# Patient Record
Sex: Female | Born: 1950 | Race: Black or African American | Hispanic: No | Marital: Married | Smoking: Never smoker
Health system: Southern US, Community
[De-identification: ages and names within clinical notes are randomized; demographics above are authoritative.]

## PROBLEM LIST (undated history)

## (undated) DIAGNOSIS — K219 Gastro-esophageal reflux disease without esophagitis: Secondary | ICD-10-CM

## (undated) DIAGNOSIS — N200 Calculus of kidney: Secondary | ICD-10-CM

## (undated) HISTORY — PX: LITHOTRIPSY: SUR834

## (undated) HISTORY — DX: Gastro-esophageal reflux disease without esophagitis: K21.9

---

## 1994-08-20 HISTORY — PX: ABDOMINAL HYSTERECTOMY: SHX81

## 1997-12-09 ENCOUNTER — Other Ambulatory Visit: Admission: RE | Admit: 1997-12-09 | Discharge: 1997-12-09 | Payer: Self-pay | Admitting: Internal Medicine

## 1998-01-05 ENCOUNTER — Ambulatory Visit (HOSPITAL_COMMUNITY): Admission: RE | Admit: 1998-01-05 | Discharge: 1998-01-05 | Payer: Self-pay | Admitting: Gastroenterology

## 1999-02-22 ENCOUNTER — Ambulatory Visit (HOSPITAL_COMMUNITY): Admission: RE | Admit: 1999-02-22 | Discharge: 1999-02-22 | Payer: Self-pay | Admitting: Internal Medicine

## 1999-02-22 ENCOUNTER — Encounter: Payer: Self-pay | Admitting: Internal Medicine

## 1999-09-20 ENCOUNTER — Encounter: Admission: RE | Admit: 1999-09-20 | Discharge: 1999-09-20 | Payer: Self-pay | Admitting: Internal Medicine

## 1999-09-20 ENCOUNTER — Encounter: Payer: Self-pay | Admitting: Internal Medicine

## 2000-02-27 ENCOUNTER — Ambulatory Visit (HOSPITAL_COMMUNITY): Admission: RE | Admit: 2000-02-27 | Discharge: 2000-02-27 | Payer: Self-pay | Admitting: Internal Medicine

## 2000-02-27 ENCOUNTER — Encounter: Payer: Self-pay | Admitting: Internal Medicine

## 2001-02-13 ENCOUNTER — Other Ambulatory Visit: Admission: RE | Admit: 2001-02-13 | Discharge: 2001-02-13 | Payer: Self-pay | Admitting: Internal Medicine

## 2001-03-06 ENCOUNTER — Ambulatory Visit (HOSPITAL_COMMUNITY): Admission: RE | Admit: 2001-03-06 | Discharge: 2001-03-06 | Payer: Self-pay | Admitting: Internal Medicine

## 2001-03-06 ENCOUNTER — Encounter: Payer: Self-pay | Admitting: Internal Medicine

## 2001-10-30 ENCOUNTER — Ambulatory Visit (HOSPITAL_COMMUNITY): Admission: RE | Admit: 2001-10-30 | Discharge: 2001-10-30 | Payer: Self-pay | Admitting: Gastroenterology

## 2002-09-18 ENCOUNTER — Ambulatory Visit (HOSPITAL_COMMUNITY): Admission: RE | Admit: 2002-09-18 | Discharge: 2002-09-18 | Payer: Self-pay | Admitting: Internal Medicine

## 2002-09-18 ENCOUNTER — Encounter: Payer: Self-pay | Admitting: Internal Medicine

## 2002-10-19 ENCOUNTER — Encounter (INDEPENDENT_AMBULATORY_CARE_PROVIDER_SITE_OTHER): Payer: Self-pay | Admitting: *Deleted

## 2004-03-29 ENCOUNTER — Encounter: Admission: RE | Admit: 2004-03-29 | Discharge: 2004-03-29 | Payer: Self-pay | Admitting: Sports Medicine

## 2004-03-29 ENCOUNTER — Ambulatory Visit (HOSPITAL_COMMUNITY): Admission: RE | Admit: 2004-03-29 | Discharge: 2004-03-29 | Payer: Self-pay | Admitting: Family Medicine

## 2005-04-13 ENCOUNTER — Ambulatory Visit (HOSPITAL_COMMUNITY): Admission: RE | Admit: 2005-04-13 | Discharge: 2005-04-13 | Payer: Self-pay | Admitting: Obstetrics and Gynecology

## 2005-04-18 ENCOUNTER — Encounter: Admission: RE | Admit: 2005-04-18 | Discharge: 2005-04-18 | Payer: Self-pay | Admitting: Obstetrics and Gynecology

## 2005-06-12 ENCOUNTER — Encounter: Admission: RE | Admit: 2005-06-12 | Discharge: 2005-08-16 | Payer: Self-pay | Admitting: Family Medicine

## 2005-07-09 ENCOUNTER — Encounter: Admission: RE | Admit: 2005-07-09 | Discharge: 2005-07-09 | Payer: Self-pay | Admitting: Obstetrics and Gynecology

## 2005-08-30 ENCOUNTER — Encounter: Admission: RE | Admit: 2005-08-30 | Discharge: 2005-10-25 | Payer: Self-pay | Admitting: Orthopedic Surgery

## 2005-11-08 ENCOUNTER — Encounter: Admission: RE | Admit: 2005-11-08 | Discharge: 2005-12-25 | Payer: Self-pay | Admitting: Orthopedic Surgery

## 2006-05-03 ENCOUNTER — Encounter: Admission: RE | Admit: 2006-05-03 | Discharge: 2006-05-03 | Payer: Self-pay | Admitting: Obstetrics and Gynecology

## 2006-06-20 ENCOUNTER — Ambulatory Visit (HOSPITAL_COMMUNITY): Admission: RE | Admit: 2006-06-20 | Discharge: 2006-06-20 | Payer: Self-pay | Admitting: Obstetrics and Gynecology

## 2006-06-20 ENCOUNTER — Encounter (INDEPENDENT_AMBULATORY_CARE_PROVIDER_SITE_OTHER): Payer: Self-pay | Admitting: Specialist

## 2006-10-18 ENCOUNTER — Encounter (INDEPENDENT_AMBULATORY_CARE_PROVIDER_SITE_OTHER): Payer: Self-pay | Admitting: *Deleted

## 2007-08-01 ENCOUNTER — Encounter: Admission: RE | Admit: 2007-08-01 | Discharge: 2007-08-01 | Payer: Self-pay | Admitting: Obstetrics and Gynecology

## 2008-11-29 ENCOUNTER — Encounter: Admission: RE | Admit: 2008-11-29 | Discharge: 2008-11-29 | Payer: Self-pay | Admitting: Obstetrics and Gynecology

## 2009-12-01 ENCOUNTER — Encounter: Admission: RE | Admit: 2009-12-01 | Discharge: 2009-12-01 | Payer: Self-pay | Admitting: Obstetrics and Gynecology

## 2010-09-10 ENCOUNTER — Encounter: Payer: Self-pay | Admitting: Obstetrics and Gynecology

## 2010-09-10 ENCOUNTER — Encounter: Payer: Self-pay | Admitting: *Deleted

## 2010-11-27 ENCOUNTER — Other Ambulatory Visit: Payer: Self-pay | Admitting: Obstetrics and Gynecology

## 2010-11-27 DIAGNOSIS — Z1231 Encounter for screening mammogram for malignant neoplasm of breast: Secondary | ICD-10-CM

## 2010-12-15 ENCOUNTER — Ambulatory Visit: Payer: Self-pay

## 2010-12-19 ENCOUNTER — Other Ambulatory Visit: Payer: Self-pay | Admitting: Obstetrics and Gynecology

## 2010-12-19 DIAGNOSIS — Z1231 Encounter for screening mammogram for malignant neoplasm of breast: Secondary | ICD-10-CM

## 2010-12-19 DIAGNOSIS — Z780 Asymptomatic menopausal state: Secondary | ICD-10-CM

## 2010-12-20 ENCOUNTER — Other Ambulatory Visit: Payer: Self-pay | Admitting: Obstetrics and Gynecology

## 2010-12-20 DIAGNOSIS — Z1211 Encounter for screening for malignant neoplasm of colon: Secondary | ICD-10-CM

## 2010-12-20 DIAGNOSIS — Z86010 Personal history of colonic polyps: Secondary | ICD-10-CM

## 2011-01-05 ENCOUNTER — Ambulatory Visit
Admission: RE | Admit: 2011-01-05 | Discharge: 2011-01-05 | Disposition: A | Discharge status: Home / Self Care | Payer: BC Managed Care – PPO | Source: Ambulatory Visit | Attending: Obstetrics and Gynecology | Admitting: Obstetrics and Gynecology

## 2011-01-05 DIAGNOSIS — Z780 Asymptomatic menopausal state: Secondary | ICD-10-CM

## 2011-01-05 DIAGNOSIS — Z1231 Encounter for screening mammogram for malignant neoplasm of breast: Secondary | ICD-10-CM

## 2011-01-05 NOTE — Procedures (Signed)
[place]  Patient:    Sheila Sweeney, Sheila Sweeney Visit Number: [id number] MRN: [id number]          Service Type: END Location: ENDO Attending Physician:  Charna Elizabeth Dictated by:   Anselmo Rod, M.D. Proc. Date: 10/30/01 Admit Date:  10/30/2001 Discharge Date: 10/30/2001   CC:         Cala Bradford R. Renae Gloss, M.D.   Procedure Report  DATE OF BIRTH:  Dec 31, 1950  PROCEDURE PERFORMED:  Colonoscopy and endoscopy.  ENDOSCOPIST:  Anselmo Rod, M.D.  INSTRUMENT USED:  Olympus video colonoscope.  INDICATION FOR PROCEDURE:  Rectal bleeding in a 60 year old female to rule out colonic polyps, masses, hemorrhoids, etc.  PREPROCEDURE PREPARATION:  Informed consent was procured from the patient. The patient fasted for eight hours prior to the procedure and prepped with a bottle of magnesium citrate and a gallon of NuLytely the night prior to the procedure.  PREPROCEDURAL PHYSICAL:  VITAL SIGNS:  The patient had stable vital signs.  NECK:  Supple.  CHEST:  Clear to auscultation.  No rales or rhonchi.  ABDOMEN:  Soft with normal bowel sounds.  DESCRIPTION OF PROCEDURE:  The patient was placed in the left lateral decubitus position and sedated with 70 mg of Demerol and 6.5 mg of Versed intravenously.  Once the patient was adequately sedated and maintained on low flow oxygen, and continuous cardiac monitoring, the Olympus video colonoscope was advanced from the rectum to the cecum without difficulty.  The appendiceal orifice and ileocecal valve were visualized and photographed.  Small internal and external hemorrhoids were appreciated.  The entire colonic mucosa, otherwise, appeared healthy and without lesions.  IMPRESSION:  Normal appearing colon up to the cecum except for small nonbleeding internal and external hemorrhoids.  RECOMMENDATIONS: 1. A high fiber diet was suggested for the patient. 2. Repeat colorectal cancer screening is  recommended in the next 5-10    years unless the patient should develop any abnormal symptoms in the    interim.Dictated by:   Anselmo Rod, M.D. Attending Physician:  Charna Elizabeth DD:  10/30/01 TD:  11/01/01 Job: [id number]

## 2011-01-05 NOTE — Op Note (Signed)
NAME:  Sheila Sweeney, Sheila Sweeney                ACCOUNT NO.:  [id number]   MEDICAL RECORD NO.:  [id number]          PATIENT TYPE:  AMB   LOCATION:  SDC                           FACILITY:  [facility]   PHYSICIAN:  Huel Cote, M.D. DATE OF BIRTH:  16-Dec-1950   DATE OF PROCEDURE:  07/01/2006  DATE OF DISCHARGE:  06/20/2006                                 OPERATIVE REPORT   PREOPERATIVE DIAGNOSIS:  Vaginal nodule.   POSTOPERATIVE DIAGNOSIS:  Vaginal nodule.   PROCEDURE:  Excision of vaginal nodule.   SURGEON:  Huel Cote, M.D.   ANESTHESIA:  LMA with 1% plain local block.   SPECIMEN:  Vaginal nodule, approximately 3-4 cm; appearing to be a fibroid  by gross analysis.   COMPLICATIONS:  None.   FINDINGS:  Solid nodule was removed from the right vaginal sidewall,  approximately 3-4 cm in diameter.  It did grossly appear to be a fibroid and  was sent to pathology for final analysis.   PROCEDURE:  The patient was taken to the operating room, where LMA  anesthesia was obtained without difficulty.  She was then prepped and draped  in normal sterile fashion in the dorsal lithotomy position.  A speculum was  placed within the vagina, and a right vaginal sidewall nodule which was  closest to the cervix was identified and injected with 1% plain lidocaine  circumferentially.  An approximately 3 cm incision was made on top of the  nodule, and gross dissection performed.  The vaginal mucosa was pushed away  from the nodule, which was dissected out away from the sidewall.  This could  be shelled out and the base was cut away sharply.  In appearance it appeared  to be very similar to a fibroid and was sent off to pathology.  The incision  was then closed in 2 layers, the first layer of 2-0 Vicryl several  interrupted sutures, and the mucosa itself was closed with 2-0 Vicryl in a  running fashion.  Good hemostasis was noted.  There was no active bleeding  and the patient was then awakened and taken to  the recovery room in stable  condition.      Huel Cote, M.D.  Electronically Signed     KR/MEDQ  D:  07/01/2006  T:  07/01/2006  Job:  [id number]

## 2011-01-05 NOTE — H&P (Signed)
NAME:  Sheila Sweeney, Sheila Sweeney                ACCOUNT NO.:  [id number]   MEDICAL RECORD NO.:  [id number]          PATIENT TYPE:  AMB   LOCATION:                                FACILITY:  [facility]   PHYSICIAN:  Huel Cote, M.D. DATE OF BIRTH:  November 29, 1950   DATE OF ADMISSION:  06/20/2006  DATE OF DISCHARGE:                                HISTORY & PHYSICAL   HISTORY OF PRESENT ILLNESS:  The patient is a 60 year old G-I, P-I who is  coming in for a resection of a vaginal nodule noted on her GYN exam.  The  patient first had this nodule noted in August 2006, and at that time we  discussed removal; however, she failed to follow up, and on her September  2007, exam the nodule was persistent and perhaps slightly bigger.  This  nodule was very firm and it's etiology is not completely clear.  Therefore  it was recommended an excision, to rule out anything significant, as far as  pathology, although as slow growing as it appears to be, it most likely will  hopefully be benign.   PAST OBSTETRICAL HISTORY:  Significant for a vaginal delivery in 1973.   PAST MEDICAL HISTORY:  1. Significant for polyps.  2. Scoliosis.  3. Nephrolithiasis.  4. Reflux disease.   PAST GYN HISTORY:  No abnormal Pap smears.   PAST SURGICAL HISTORY:  She did have a TAH and LSO for a fibroid uterus in  1996.   MEDICATIONS:  The patient is currently on Protonix only.   ALLERGIES:  No known drug allergies.   PHYSICAL EXAMINATION:  VITAL SIGNS:  Weight 176 pounds, blood pressure  120/70.  HEART:  Regular rate and rhythm.  LUNGS:  Clear.  ABDOMEN:  Soft, nontender.  PELVIC:  The patient has normal external genitalia noted.  Her cuff is well-  supported and just before the vaginal cuff on the right side wall, the  patient has a nodule which is easily palpable approximately 2 to 3 cm in  size, as opposed to 1 to 2 cm in size the previous year.  BIMANUAL EXAM:  Has no appreciable masses.   Again, the patient was counseled  to the risks and benefits of surgery,  including bleeding and infection, and given the nature of this nodule being  close to the vaginal cuff, it was felt that she would be best served at  resection in the  operating room with sedation, to avoid unnecessary discomfort with  retraction.  The patient was counseled that we will resect the nodule and  send it for pathology and see if any further followup is warranted.  After a  discussion of the risks and benefits, she is agreeable to proceed.      Huel Cote, M.D.  Electronically Signed     KR/MEDQ  D:  06/18/2006  T:  06/18/2006  Job:  [id number]

## 2011-01-09 ENCOUNTER — Other Ambulatory Visit: Payer: Self-pay | Admitting: Obstetrics and Gynecology

## 2011-01-09 DIAGNOSIS — R928 Other abnormal and inconclusive findings on diagnostic imaging of breast: Secondary | ICD-10-CM

## 2011-01-12 ENCOUNTER — Ambulatory Visit
Admission: RE | Admit: 2011-01-12 | Discharge: 2011-01-12 | Disposition: A | Discharge status: Home / Self Care | Payer: BC Managed Care – PPO | Source: Ambulatory Visit | Attending: Obstetrics and Gynecology | Admitting: Obstetrics and Gynecology

## 2011-01-12 DIAGNOSIS — R928 Other abnormal and inconclusive findings on diagnostic imaging of breast: Secondary | ICD-10-CM

## 2011-03-15 ENCOUNTER — Other Ambulatory Visit: Payer: BC Managed Care – PPO

## 2011-03-16 ENCOUNTER — Ambulatory Visit
Admission: RE | Admit: 2011-03-16 | Discharge: 2011-03-16 | Disposition: A | Discharge status: Home / Self Care | Payer: BC Managed Care – PPO | Source: Ambulatory Visit | Attending: Obstetrics and Gynecology | Admitting: Obstetrics and Gynecology

## 2011-03-16 DIAGNOSIS — Z1211 Encounter for screening for malignant neoplasm of colon: Secondary | ICD-10-CM

## 2011-03-16 DIAGNOSIS — Z86010 Personal history of colonic polyps: Secondary | ICD-10-CM

## 2011-04-13 ENCOUNTER — Encounter: Payer: Self-pay | Admitting: Gastroenterology

## 2011-04-13 ENCOUNTER — Telehealth: Payer: Self-pay | Admitting: *Deleted

## 2011-04-13 ENCOUNTER — Ambulatory Visit (AMBULATORY_SURGERY_CENTER): Payer: BC Managed Care – PPO | Admitting: *Deleted

## 2011-04-13 VITALS — Ht 63.0 in | Wt 185.0 lb

## 2011-04-13 DIAGNOSIS — R933 Abnormal findings on diagnostic imaging of other parts of digestive tract: Secondary | ICD-10-CM

## 2011-04-13 MED ORDER — SUPREP BOWEL PREP KIT 17.5-3.13-1.6 GM/177ML PO SOLN: 1.0000 | Freq: Once | ORAL | Status: DC

## 2011-04-13 NOTE — Telephone Encounter (Signed)
Dr. Russella Dar- Sheila Sweeney is scheduled for direct colonoscopy 9/7.  Sheila Sweeney is self referred after having virtual colonoscopy 03/16/2011.  I am going to put reports from virtual colonoscopy and previous colonoscopy (Dr. Loreta Ave 2003) on your desk for review.  I am leaving her as direct colonoscopy unless you advise otherwise. Thanks Ezra Sites

## 2011-04-13 NOTE — Telephone Encounter (Signed)
OK for direct colonoscopy

## 2011-04-27 ENCOUNTER — Ambulatory Visit (AMBULATORY_SURGERY_CENTER): Payer: BC Managed Care – PPO | Admitting: Gastroenterology

## 2011-04-27 ENCOUNTER — Encounter: Payer: Self-pay | Admitting: Gastroenterology

## 2011-04-27 VITALS — BP 130/73 | HR 83 | Temp 97.6°F | Resp 21 | Ht 63.0 in | Wt 185.0 lb

## 2011-04-27 DIAGNOSIS — R933 Abnormal findings on diagnostic imaging of other parts of digestive tract: Secondary | ICD-10-CM

## 2011-04-27 DIAGNOSIS — D126 Benign neoplasm of colon, unspecified: Secondary | ICD-10-CM

## 2011-04-27 MED ORDER — SODIUM CHLORIDE 0.9 % IV SOLN: 500.0000 mL | INTRAVENOUS | Status: DC

## 2011-04-27 NOTE — Patient Instructions (Signed)
2- POLYPS REMOVED AND SENT TO PATHOLOGY  SEE BLUE AND GREEN SHEETS FOR ADDITIONAL D/C INSTRUCTIONS.

## 2011-04-27 NOTE — Progress Notes (Signed)
Pt was uncomfortable with the scope advancement.  Meds titrated per the MD'S orders.  Pt relaxed with the scope was being with drawn from the cecum and rested comfortably on the way out without complaints. Maw  Pt's b/p 85/54.  Pt sleeping but arrousable.  Warm, dry & pink.  Retook b/p 86/54.  IV wide open drip.  MAW

## 2011-04-30 ENCOUNTER — Telehealth: Payer: Self-pay

## 2011-04-30 NOTE — Telephone Encounter (Signed)
Follow up Call- Patient questions:  Do you have a fever, pain , or abdominal swelling? no Pain Score  0 *  Have you tolerated food without any problems? yes  Have you been able to return to your normal activities? yes  Do you have any questions about your discharge instructions: Diet   no Medications  no Follow up visit  no  Do you have questions or concerns about your Care? no  Actions: * If pain score is 4 or above: No action needed, pain <4.  It took a while for the medicine to were off, but I'm fine today.  MAW

## 2011-05-01 ENCOUNTER — Encounter: Payer: Self-pay | Admitting: Gastroenterology

## 2011-07-04 ENCOUNTER — Other Ambulatory Visit: Payer: Self-pay | Admitting: Obstetrics and Gynecology

## 2011-07-04 DIAGNOSIS — R921 Mammographic calcification found on diagnostic imaging of breast: Secondary | ICD-10-CM

## 2011-07-20 ENCOUNTER — Ambulatory Visit
Admission: RE | Admit: 2011-07-20 | Discharge: 2011-07-20 | Disposition: A | Discharge status: Home / Self Care | Payer: BC Managed Care – PPO | Source: Ambulatory Visit | Attending: Obstetrics and Gynecology | Admitting: Obstetrics and Gynecology

## 2011-07-20 DIAGNOSIS — R921 Mammographic calcification found on diagnostic imaging of breast: Secondary | ICD-10-CM

## 2012-02-20 ENCOUNTER — Other Ambulatory Visit: Payer: Self-pay | Admitting: Obstetrics and Gynecology

## 2012-02-20 DIAGNOSIS — R921 Mammographic calcification found on diagnostic imaging of breast: Secondary | ICD-10-CM

## 2012-03-10 ENCOUNTER — Encounter (HOSPITAL_COMMUNITY): Payer: Self-pay

## 2012-03-10 ENCOUNTER — Emergency Department (HOSPITAL_COMMUNITY): Payer: BC Managed Care – PPO

## 2012-03-10 ENCOUNTER — Emergency Department (HOSPITAL_COMMUNITY)
Admission: EM | Admit: 2012-03-10 | Discharge: 2012-03-10 | Disposition: A | Discharge status: Home / Self Care | Payer: BC Managed Care – PPO | Attending: Emergency Medicine | Admitting: Emergency Medicine

## 2012-03-10 DIAGNOSIS — K219 Gastro-esophageal reflux disease without esophagitis: Secondary | ICD-10-CM | POA: Insufficient documentation

## 2012-03-10 DIAGNOSIS — R109 Unspecified abdominal pain: Secondary | ICD-10-CM | POA: Insufficient documentation

## 2012-03-10 DIAGNOSIS — N390 Urinary tract infection, site not specified: Secondary | ICD-10-CM | POA: Insufficient documentation

## 2012-03-10 DIAGNOSIS — R319 Hematuria, unspecified: Secondary | ICD-10-CM | POA: Insufficient documentation

## 2012-03-10 DIAGNOSIS — R1032 Left lower quadrant pain: Secondary | ICD-10-CM | POA: Insufficient documentation

## 2012-03-10 DIAGNOSIS — Z87442 Personal history of urinary calculi: Secondary | ICD-10-CM | POA: Insufficient documentation

## 2012-03-10 HISTORY — DX: Calculus of kidney: N20.0

## 2012-03-10 LAB — URINE MICROSCOPIC-ADD ON

## 2012-03-10 LAB — URINALYSIS, ROUTINE W REFLEX MICROSCOPIC
Glucose, UA: NEGATIVE mg/dL
Protein, ur: 100 mg/dL — AB
pH: 6 (ref 5.0–8.0)

## 2012-03-10 MED ORDER — ACETAMINOPHEN 325 MG PO TABS: 650.0000 mg | ORAL_TABLET | Freq: Once | ORAL | Status: DC

## 2012-03-10 MED ORDER — CEPHALEXIN 500 MG PO CAPS
500.0000 mg | ORAL_CAPSULE | Freq: Once | ORAL | Status: AC
  Administered 2012-03-10: 500 mg via ORAL
  Filled 2012-03-10: qty 1

## 2012-03-10 MED ORDER — TRAMADOL HCL 50 MG PO TABS
100.0000 mg | ORAL_TABLET | Freq: Once | ORAL | Status: AC
  Administered 2012-03-10: 100 mg via ORAL
  Filled 2012-03-10: qty 2

## 2012-03-10 MED ORDER — CEPHALEXIN 500 MG PO CAPS: 500.0000 mg | ORAL_CAPSULE | Freq: Three times a day (TID) | ORAL | Status: AC

## 2012-03-10 MED ORDER — TRAMADOL-ACETAMINOPHEN 37.5-325 MG PO TABS: ORAL_TABLET | ORAL | Status: AC

## 2012-03-10 MED ORDER — IBUPROFEN 800 MG PO TABS: 800.0000 mg | ORAL_TABLET | Freq: Once | ORAL | Status: DC

## 2012-03-10 NOTE — ED Notes (Signed)
EDP in room  

## 2012-03-10 NOTE — ED Provider Notes (Signed)
History     CSN: [id number]  Arrival date & time 03/10/12  0850   First MD Initiated Contact with Patient 03/10/12 [phone]      No chief complaint on file.   (Consider location/radiation/quality/duration/timing/severity/associated sxs/prior treatment) HPI Patient relates yesterday she started seeing blood in her urine. She also complained of a left flank achiness that radiated into her left lower quadrant but got very intense. She states she had difficulty standing still. She states she took a pretty powder and the pain improved. She states she continues to see blood in her urine today. She states she had nausea and vomiting last night but not today. She denies diarrhea, dysuria, urgency, frequency, fever. Patient relates she had kidney stones in the 1980s and had to have cystoscopy with laser removal done at that time. She reports she does not drink a lot of milk products or caffeine drinks.   Gynecologist Dr. Huel Cote   Past Medical History  Diagnosis Date  . GERD (gastroesophageal reflux disease)   . Glaucoma     borderlind, not using drops  . Kidney stone     Past Surgical History  Procedure Date  . Abdominal hysterectomy 1996  . Lithotripsy     Family History  Problem Relation Age of Onset  . Colon polyps Neg Hx   . Colon cancer Neg Hx   . Stomach cancer Neg Hx     History  Substance Use Topics  . Smoking status: Never Smoker   . Smokeless tobacco: Not on file  . 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    Allergies  Review of patient's allergies indicates no known allergies.  Home Medications   Current Outpatient Rx  Name Route Sig Dispense Refill  . CEPHALEXIN 500 MG PO CAPS Oral Take 1 capsule (500 mg total) by mouth 3 (three) times daily. 30 capsule 0  .  TRAMADOL-ACETAMINOPHEN 37.5-325 MG PO TABS  2 tabs po QID prn pain 16 tablet 0    BP 139/69  Pulse 97  Resp 16  SpO2 99%  Vital signs normal    Physical Exam  Constitutional: She is oriented to person, place, and time. She appears well-developed and well-nourished.  Non-toxic appearance. She does not appear ill. No distress.  HENT:  Head: Normocephalic and atraumatic.  Right Ear: External ear normal.  Left Ear: External ear normal.  Nose: Nose normal. No mucosal edema or rhinorrhea.  Mouth/Throat: Oropharynx is clear and moist and mucous membranes are normal. No dental abscesses or uvula swelling.  Eyes: Conjunctivae and EOM are normal. Pupils are equal, round, and reactive to light.  Neck: Normal range of motion and full passive range of motion without pain. Neck supple.  Cardiovascular: Normal rate, regular rhythm and normal heart sounds.  Exam reveals no gallop and no friction rub.   No murmur heard. Pulmonary/Chest: Effort normal and breath sounds normal. No respiratory distress. She has no wheezes. She has no rhonchi. She has no rales. She exhibits no tenderness and no crepitus.  Abdominal: Soft. Normal appearance and bowel sounds are normal. She exhibits no distension. There is tenderness in the left lower quadrant. There is no rebound and no guarding.  Patient has mild tenderness in her left flank and in her left lower quadrant. There is no guarding or rebound.  Musculoskeletal: Normal range of motion. She exhibits no edema and no tenderness.       Arms:      Moves all extremities well.   Neurological: She is alert and oriented to person, place, and time. She has normal strength. No cranial nerve deficit.  Skin: Skin is warm, dry and intact. No rash noted. No erythema. No pallor.  Psychiatric: She has a normal mood and affect. Her speech is normal and behavior is normal. Her mood appears not anxious.    ED Course  Procedures (including critical care time)  Patient  refused pain medication at time of my initial encounter. She was advised if she changed her mind to let her nurse know.   Medications  cephALEXin (KEFLEX) capsule 500 mg (not administered)  traMADol (ULTRAM) tablet 100 mg (not administered)  ibuprofen (ADVIL,MOTRIN) tablet 800 mg (not administered)  acetaminophen (TYLENOL) tablet 650 mg (not administered)   Pt given results of UA and CT. She is now requesting pain meds, patient is driving home.   Results for orders placed during the hospital encounter of 03/10/12  URINALYSIS, ROUTINE W REFLEX MICROSCOPIC      Component Value Range   Color, Urine RED (*) YELLOW   APPearance TURBID (*) CLEAR   Specific Gravity, Urine 1.032 (*) 1.005 - 1.030   pH 6.0  5.0 - 8.0   Glucose, UA NEGATIVE  NEGATIVE mg/dL   Hgb urine dipstick LARGE (*) NEGATIVE   Bilirubin Urine SMALL (*) NEGATIVE   Ketones, ur TRACE (*) NEGATIVE mg/dL   Protein, ur 161 (*) NEGATIVE mg/dL   Urobilinogen, UA 1.0  0.0 - 1.0 mg/dL   Nitrite NEGATIVE  NEGATIVE   Leukocytes, UA SMALL (*) NEGATIVE  URINE MICROSCOPIC-ADD ON      Component Value Range   Squamous Epithelial / LPF FEW (*) RARE   WBC, UA 3-6  <3 WBC/hpf   RBC / HPF TOO NUMEROUS TO COUNT  <3 RBC/hpf   Bacteria, UA FEW (*) RARE   Laboratory interpretation all normal except hematuria, possible UTI   Ct Abdomen Pelvis Wo Contrast  03/10/2012  *RADIOLOGY REPORT*  Clinical Data: Left flank pain and hematuria.  CT ABDOMEN AND PELVIS WITHOUT CONTRAST  Technique:  Multidetector CT imaging of the abdomen and pelvis was performed following the standard protocol without intravenous contrast.  Comparison: 03/16/2011  Findings: The liver shows diffuse fatty infiltration and measures 16.8 cm in cranial caudal length, upper normal.  No focal abnormalities seen in the liver or spleen on this study performed without intravenous contrast material.  Stomach, duodenum, pancreas, gallbladder, and adrenal glands are unremarkable.  There  is no evidence for stones in either kidney.  No hydronephrosis.  No evidence for ureteral or bladder stones.  No abdominal aortic aneurysm.  No free fluid or lymphadenopathy in the abdomen.  Imaging through the pelvis shows no free intraperitoneal fluid.  No pelvic sidewall lymphadenopathy.  Uterus is surgically absent. There is no adnexal mass.  The terminal ileum and the appendix are normal.  Bone windows reveal no worrisome lytic or sclerotic osseous lesions.  Thoracolumbar scoliosis again noted.  IMPRESSION: No acute findings in the abdomen or pelvis.  Specifically, no evidence for urinary stone disease.  Original Report Authenticated By: ERIC A. MANSELL, M.D.     1. Hematuria   2. Urinary tract infection   3. Left flank  pain    New Prescriptions   CEPHALEXIN (KEFLEX) 500 MG CAPSULE    Take 1 capsule (500 mg total) by mouth 3 (three) times daily.   TRAMADOL-ACETAMINOPHEN (ULTRACET) 37.5-325 MG PER TABLET    2 tabs po QID prn pain  ibuprofen 600 mg QID  Plan discharge  Devoria Albe, MD, Armando Gang    MDM          Ward Givens, MD 03/10/12 1045

## 2012-03-10 NOTE — ED Notes (Signed)
Pt reports seeing bloody urine last night, left flank pain radiates to left side, bloody urine persist this morning, with N/V. No dysuria, hx of kidney stone, reports pain feels somewhat similar. No abd pain, no abd tenderness.

## 2012-03-11 LAB — URINE CULTURE: Colony Count: 15000

## 2012-06-12 ENCOUNTER — Other Ambulatory Visit: Payer: Self-pay | Admitting: Obstetrics and Gynecology

## 2012-06-12 ENCOUNTER — Ambulatory Visit
Admission: RE | Admit: 2012-06-12 | Discharge: 2012-06-12 | Disposition: A | Discharge status: Home / Self Care | Payer: BC Managed Care – PPO | Source: Ambulatory Visit | Attending: Obstetrics and Gynecology | Admitting: Obstetrics and Gynecology

## 2012-06-12 DIAGNOSIS — R921 Mammographic calcification found on diagnostic imaging of breast: Secondary | ICD-10-CM

## 2013-07-24 ENCOUNTER — Other Ambulatory Visit: Payer: Self-pay | Admitting: Obstetrics and Gynecology

## 2013-07-24 DIAGNOSIS — R921 Mammographic calcification found on diagnostic imaging of breast: Secondary | ICD-10-CM

## 2013-08-11 ENCOUNTER — Ambulatory Visit
Admission: RE | Admit: 2013-08-11 | Discharge: 2013-08-11 | Disposition: A | Discharge status: Home / Self Care | Payer: BC Managed Care – PPO | Source: Ambulatory Visit | Attending: Obstetrics and Gynecology | Admitting: Obstetrics and Gynecology

## 2013-08-11 DIAGNOSIS — R921 Mammographic calcification found on diagnostic imaging of breast: Secondary | ICD-10-CM

## 2014-10-08 ENCOUNTER — Other Ambulatory Visit: Payer: Self-pay

## 2014-10-08 DIAGNOSIS — Z1231 Encounter for screening mammogram for malignant neoplasm of breast: Secondary | ICD-10-CM

## 2014-11-26 ENCOUNTER — Ambulatory Visit: Payer: BC Managed Care – PPO

## 2015-05-02 ENCOUNTER — Ambulatory Visit: Payer: BC Managed Care – PPO | Admitting: Dietician

## 2015-05-11 ENCOUNTER — Encounter: Payer: BC Managed Care – PPO | Attending: Obstetrics and Gynecology | Admitting: Dietician

## 2015-05-11 VITALS — Ht 63.0 in | Wt 184.0 lb

## 2015-05-11 DIAGNOSIS — E669 Obesity, unspecified: Secondary | ICD-10-CM | POA: Insufficient documentation

## 2015-05-11 DIAGNOSIS — Z6832 Body mass index (BMI) 32.0-32.9, adult: Secondary | ICD-10-CM | POA: Insufficient documentation

## 2015-05-11 DIAGNOSIS — Z713 Dietary counseling and surveillance: Secondary | ICD-10-CM | POA: Diagnosis not present

## 2015-05-11 NOTE — Patient Instructions (Signed)
Goals:  Follow Diabetes Meal Plan as instructed  Eat 3 meals and 2 snacks, every 3-5 hrs  Limit carbohydrate intake to 30-45 grams carbohydrate/meal  Limit carbohydrate intake to 0-15 grams carbohydrate/snack  Add lean protein foods to meals/snacks  Aim to walk 5 x week for 30 minutes (outside or gym)  Aim to fill half of your plate with vegetables, quarter of your protein, and quarter plate with starch/carbohydrate

## 2015-05-11 NOTE — Progress Notes (Signed)
  Medical Nutrition Therapy:  Appt start time: 1640 end time:  0786.   Assessment:  Primary concerns today: Sheila Sweeney is here today since her doctor recommended that she talk to a dietitian. Blood sugar has been elevated and Hgb A1c was 6.2% in June. They have been watching it for the couple of years. Has not made any changes to her diet. Started walking 2 months. Doctor would like her to learn how to eat correctly so that she would not develop diabetes.   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 Hours vary but normally tries to get home by 6-6:30. Lives with her grandson who takes care of himself. Eats one large meal per day and 2 smaller meals. Has been eating the same way for a long time.   Feels like she doesn't have a regular eating schedule which may be her problem.   Preferred Learning Style:   No preference indicated   Learning Readiness:   Ready  MEDICATIONS: see list   DIETARY INTAKE:  Usual eating pattern includes 3 meals and 0 snacks per day.  Avoided foods include: beans, doesn't like a lot of meat, asparagus  24-hr recall:  B ( AM): activia and protein shake with milk, smoothie mix and fruit (30g protein, 45+ g of CHO) Snk ( AM): none L (1-4 PM): kid's portion cracker barrell - meatloaf, potato and string beans or chicken and dumplings or chicken pot pie or burger or slice of pizza or sandwich on hot dog on weekends Snk ( PM): none D (PM): protein shake (9 g of CHO) with 1 cup of trail mix Snk ( PM): ice cream Beverages: water, 1 cup of green juice (Naked Juice)  Usual physical activity: walks for 30 minutes 2 x week  Estimated energy needs: 1600 calories 180 g carbohydrates 120 g protein 44 g fat  Progress Towards Goal(s):  In progress.   Nutritional Diagnosis:  [place]-3.3 Overweight/obesity As related to hx of grazing throughout the day and inadequate physical activity.  As evidenced by BMI of 32.6 and Hgb A1c of 6.2%.     Intervention:  Nutrition counseling provided. Plan: Goals:  Follow Diabetes Meal Plan as instructed  Eat 3 meals and 2 snacks, every 3-5 hrs  Limit carbohydrate intake to 30-45 grams carbohydrate/meal  Limit carbohydrate intake to 0-15 grams carbohydrate/snack  Add lean protein foods to meals/snacks  Aim to walk 5 x week for 30 minutes (outside or gym)  Aim to fill half of your plate with vegetables, quarter of your protein, and quarter plate with starch/carbohydrate  Teaching Method Utilized:  Visual Auditory Hands on  Handouts given during visit include:  Living Well With Diabetes  Meal card  MyPlate Handout  Barriers to learning/adherence to lifestyle change: none  Demonstrated degree of understanding via:  Teach Back   Monitoring/Evaluation:  Dietary intake, exercise, and body weight in 6 week(s).

## 2015-09-25 IMAGING — MG MM DIAGNOSTIC BILATERAL
8 series · 8 of 8 positions shown · non-contrast
Comparison: 06/12/2012 and earlier

CLINICAL DATA: Followup for right breast calcifications.

EXAM:
DIGITAL DIAGNOSTIC  BILATERAL MAMMOGRAM WITH CAD

[R CC (1 of 3)]
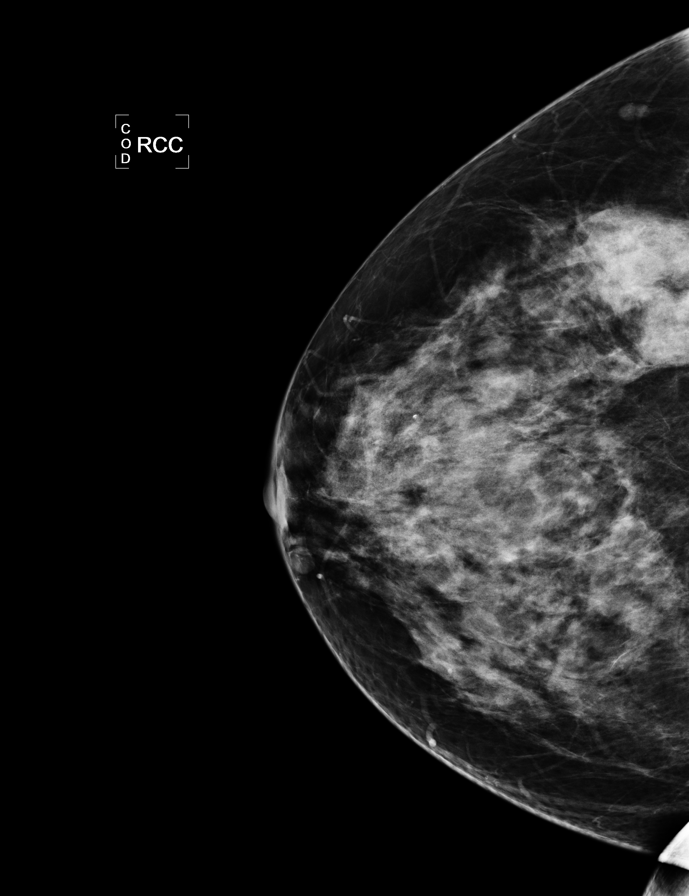

[L CC]
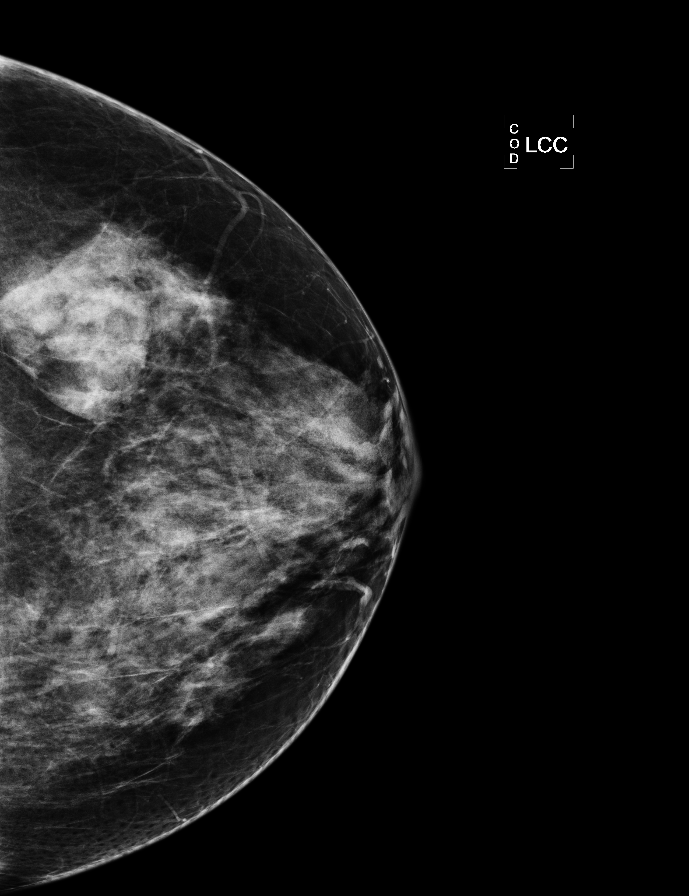

[L MLO]
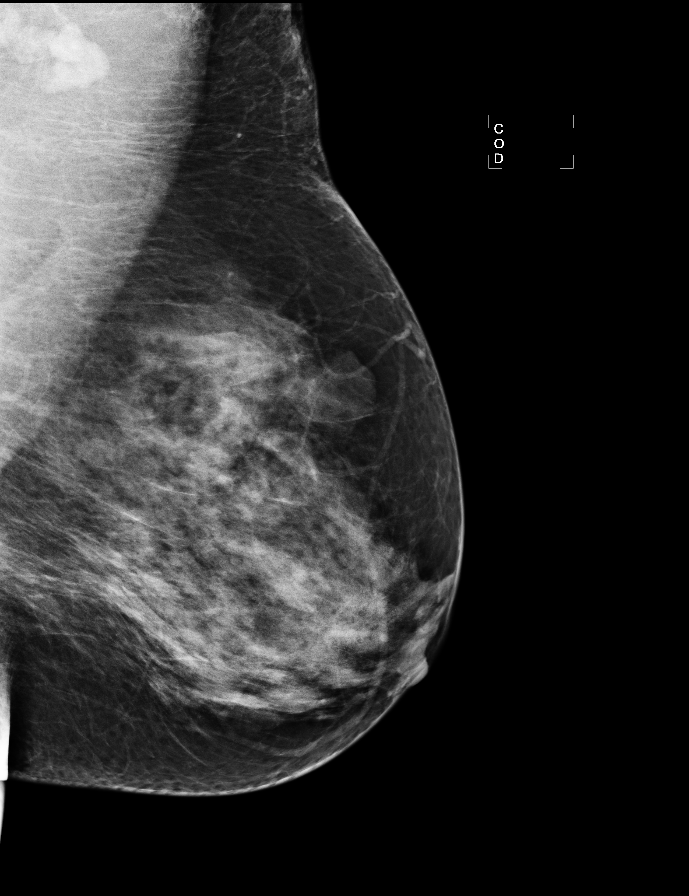

[R MLO]
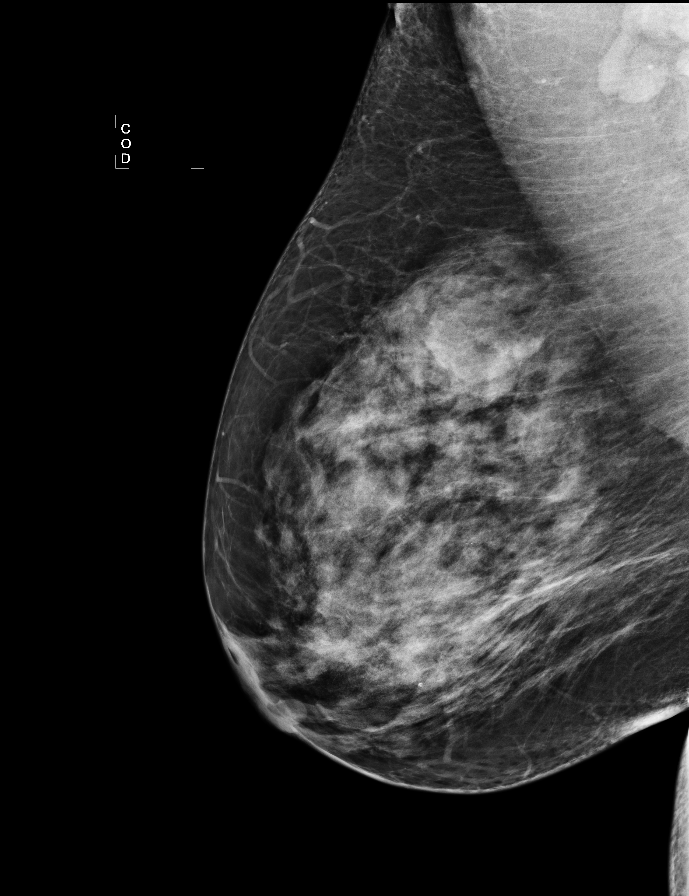

[R CC (2 of 3)]
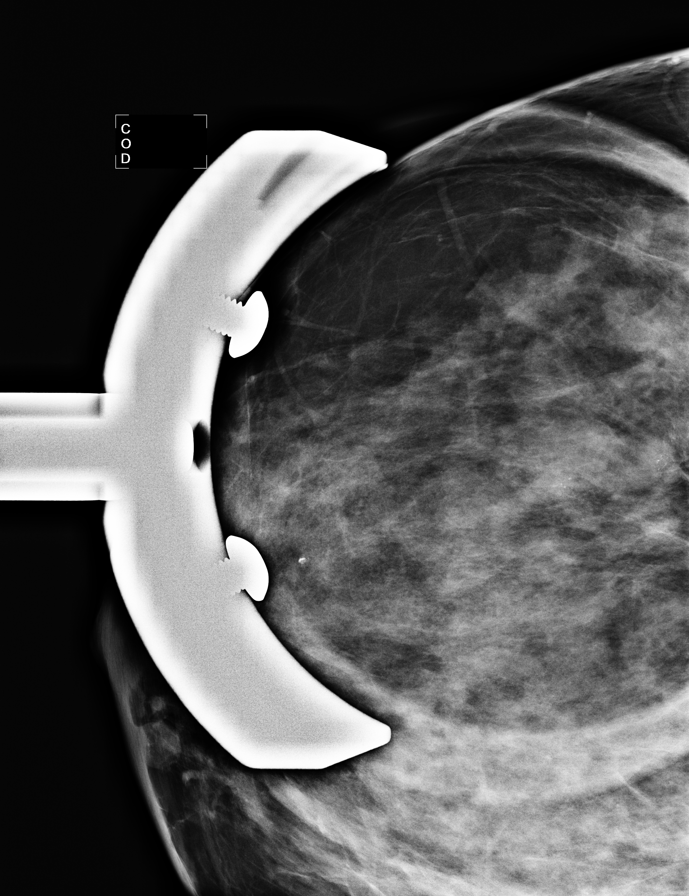

[R ML (1 of 2)]
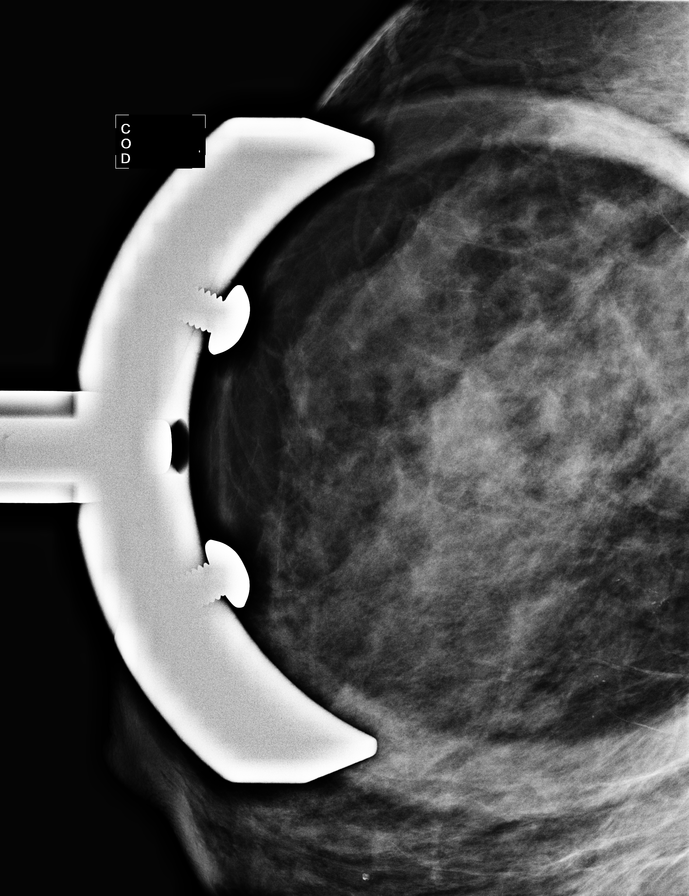

[R CC (3 of 3)]
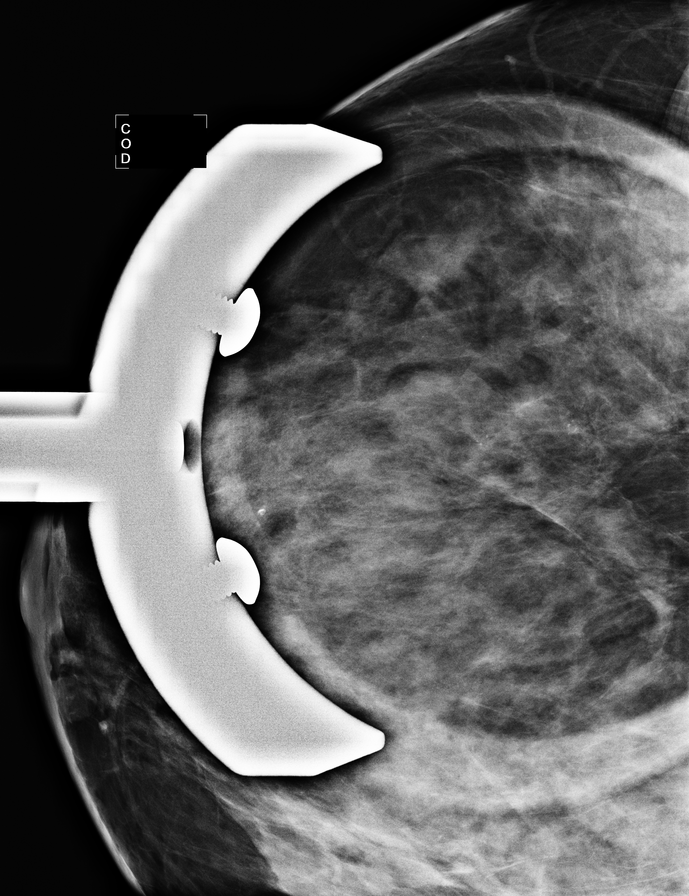

[R ML (2 of 2)]
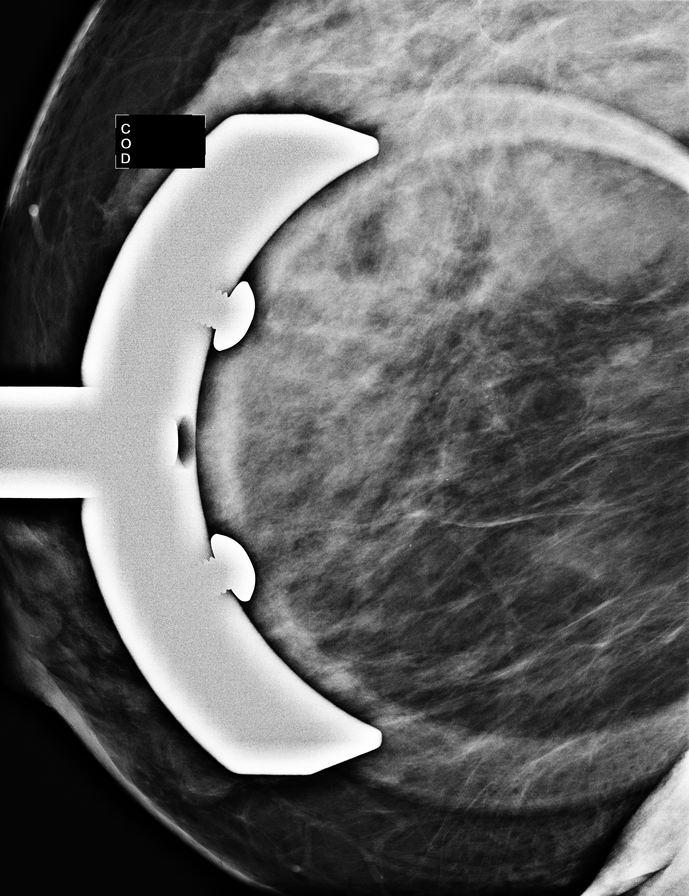

[8 of 8 positions shown; findings below may reference images not displayed]

ACR Breast Density Category c: The breasts are heterogeneously
dense, which may obscure small masses.
FINDINGS: No suspicious mass, distortion, or microcalcifications are
identified to suggest presence of malignancy. Magnified views
calcifications in the right breast show benign morphology and
distribution. No suspicious microcalcifications are identified.
Calcifications have been stable for more than 2 years.

Mammographic images were processed with CAD.
IMPRESSION: 1.  No mammographic or evidence for malignancy.
2. Benign appearing calcifications in the right breast have been
stable, consistent with benign process.

RECOMMENDATION:
Screening mammogram in one year.(Code:[id number])

I have discussed the findings and recommendations with the patient.
Results were also provided in writing at the conclusion of the
visit. If applicable, a reminder letter will be sent to the patient
regarding the next appointment.

BI-RADS CATEGORY  2: Benign Finding(s)

## 2016-06-14 ENCOUNTER — Other Ambulatory Visit: Payer: Self-pay | Admitting: Obstetrics and Gynecology

## 2016-06-14 DIAGNOSIS — E2839 Other primary ovarian failure: Secondary | ICD-10-CM

## 2016-06-18 ENCOUNTER — Other Ambulatory Visit: Payer: BC Managed Care – PPO

## 2016-06-20 ENCOUNTER — Telehealth: Payer: Self-pay | Admitting: Gastroenterology

## 2016-06-20 NOTE — Telephone Encounter (Signed)
Patient needs to have an office visit if she wishes to schedule colonoscopy at this time.  Not due til 2022

## 2016-06-22 ENCOUNTER — Ambulatory Visit
Admission: RE | Admit: 2016-06-22 | Discharge: 2016-06-22 | Disposition: A | Discharge status: Home / Self Care | Payer: BC Managed Care – PPO | Source: Ambulatory Visit | Attending: Obstetrics and Gynecology | Admitting: Obstetrics and Gynecology

## 2016-06-22 DIAGNOSIS — E2839 Other primary ovarian failure: Secondary | ICD-10-CM

## 2016-06-22 NOTE — Telephone Encounter (Signed)
Left message for pt to cb and sch ov

## 2019-11-16 ENCOUNTER — Ambulatory Visit: Payer: Medicare PPO | Admitting: Podiatry

## 2019-11-16 ENCOUNTER — Other Ambulatory Visit: Payer: Self-pay

## 2019-11-16 ENCOUNTER — Ambulatory Visit (INDEPENDENT_AMBULATORY_CARE_PROVIDER_SITE_OTHER): Payer: Medicare PPO

## 2019-11-16 DIAGNOSIS — M2042 Other hammer toe(s) (acquired), left foot: Secondary | ICD-10-CM | POA: Diagnosis not present

## 2019-11-16 DIAGNOSIS — M2041 Other hammer toe(s) (acquired), right foot: Secondary | ICD-10-CM | POA: Diagnosis not present

## 2019-11-16 DIAGNOSIS — L989 Disorder of the skin and subcutaneous tissue, unspecified: Secondary | ICD-10-CM | POA: Diagnosis not present

## 2019-11-16 NOTE — Patient Instructions (Signed)
Corn and callus remover at Pharmacy. Daily x 2 weeks.

## 2019-11-18 NOTE — Progress Notes (Signed)
   HPI: 69 y.o. female presenting today as a new patient with a chief complaint of sharp, burning pain noted to the 2nd digits bilaterally that has been gradually worsening over the past two years. She reports associated swelling that occurs daily. Wearing shoes increases the pain. She has been soaking the feet in Epsom salt for treatment. She states her PCP diagnosed her with hammertoes. Patient is here for further evaluation and treatment.   Past Medical History:  Diagnosis Date  . GERD (gastroesophageal reflux disease)   . Glaucoma    borderlind, not using drops  . Kidney stone       Objective: Physical Exam General: The patient is alert and oriented x3 in no acute distress.  Dermatology: Corns noted to the bilateral 2nd toes. Pain with palpation noted to the corns. Skin is cool, dry and supple bilateral lower extremities. Negative for open lesions or macerations.  Vascular: Palpable pedal pulses bilaterally. No edema or erythema noted. Capillary refill within normal limits.  Neurological: Epicritic and protective threshold grossly intact bilaterally.   Musculoskeletal Exam: All pedal and ankle joints range of motion within normal limits bilateral. Muscle strength 5/5 in all groups bilateral. Hammertoe contracture deformity noted to digits 2-5 of the bilateral feet.  Radiographic Exam: Hammertoe contracture deformity noted to the interphalangeal joints and MPJ of the respective hammertoe digits mentioned on clinical musculoskeletal exam.     Assessment: 1. Corns bilateral 2nd toes  2. 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 Patient evaluated. X-Rays reviewed.  2. Excisional debridement of keratotic lesion(s) using a chisel blade was performed without incident. Light dressing applied.  3. Recommended conservative treatment at this time.  4. Recommended OTC corn and callus remover.  5. Return to clinic in 4 weeks. If not better, we will discuss  surgery.     Edrick Kins, DPM [facility]  Dr. Edrick Kins, DPM    [address], [place] [address]                Office [phone]  Fax [phone]

## 2019-12-16 ENCOUNTER — Other Ambulatory Visit: Payer: Self-pay

## 2019-12-16 ENCOUNTER — Ambulatory Visit: Payer: Medicare PPO | Admitting: Podiatry

## 2019-12-16 DIAGNOSIS — M2042 Other hammer toe(s) (acquired), left foot: Secondary | ICD-10-CM

## 2019-12-16 DIAGNOSIS — M2041 Other hammer toe(s) (acquired), right foot: Secondary | ICD-10-CM | POA: Diagnosis not present

## 2019-12-16 NOTE — Patient Instructions (Signed)
Pre-Operative Instructions  Congratulations, you have decided to take an important step towards improving your quality of life.  You can be assured that the doctors and staff at [facility] will be with you every step of the way.  Here are some important things you should know:  1. Plan to be at the surgery center/hospital at least 1 (one) hour prior to your scheduled time, unless otherwise directed by the surgical center/hospital staff.  You must have a responsible adult accompany you, remain during the surgery and drive you home.  Make sure you have directions to the surgical center/hospital to ensure you arrive on time. 2. If you are having surgery at [facility] or [facility], you will need a copy of your medical history and physical form from your family physician within one month prior to the date of surgery. We will give you a form for your primary physician to complete.  3. We make every effort to accommodate the date you request for surgery.  However, there are times where surgery dates or times have to be moved.  We will contact you as soon as possible if a change in schedule is required.   4. No aspirin/ibuprofen for one week before surgery.  If you are on aspirin, any non-steroidal anti-inflammatory medications (Mobic, Aleve, Ibuprofen) should not be taken seven (7) days prior to your surgery.  You make take Tylenol for pain prior to surgery.  5. Medications - If you are taking daily heart and blood pressure medications, seizure, reflux, allergy, asthma, anxiety, pain or diabetes medications, make sure you notify the surgery center/hospital before the day of surgery so they can tell you which medications you should take or avoid the day of surgery. 6. No food or drink after midnight the night before surgery unless directed otherwise by surgical center/hospital staff. 7. No alcoholic beverages 24-hours prior to surgery.  No smoking 24-hours prior or 24-hours after  surgery. 8. Wear loose pants or shorts. They should be loose enough to fit over bandages, boots, and casts. 9. Don't wear slip-on shoes. Sneakers are preferred. 10. Bring your boot with you to the surgery center/hospital.  Also bring crutches or a walker if your physician has prescribed it for you.  If you do not have this equipment, it will be provided for you after surgery. 11. If you have not been contacted by the surgery center/hospital by the day before your surgery, call to confirm the date and time of your surgery. 12. Leave-time from work may vary depending on the type of surgery you have.  Appropriate arrangements should be made prior to surgery with your employer. 13. Prescriptions will be provided immediately following surgery by your doctor.  Fill these as soon as possible after surgery and take the medication as directed. Pain medications will not be refilled on weekends and must be approved by the doctor. 14. Remove nail polish on the operative foot and avoid getting pedicures prior to surgery. 15. Wash the night before surgery.  The night before surgery wash the foot and leg well with water and the antibacterial soap provided. Be sure to pay special attention to beneath the toenails and in between the toes.  Wash for at least three (3) minutes. Rinse thoroughly with water and dry well with a towel.  Perform this wash unless told not to do so by your physician.  Enclosed: 1 Ice pack (please put in freezer the night before surgery)   1 Hibiclens skin cleaner     Pre-op instructions  If you have any questions regarding the instructions, please do not hesitate to call our office.  [place]: [address], [place], [place] [address] -- [phone]  [place]: [address] [place], [place] [address] -- [phone]  [place]: [address], [place], [place] [address] -- [phone]   Website: [email or web address] 

## 2019-12-18 NOTE — Progress Notes (Signed)
   HPI: 69 y.o. female presenting today for follow up evaluation of hammertoes and corns. She reports continued intermittent pain but states it depends on what type of shoe she is wearing. She continues conservative treatment of Epsom salt soaks and OTC corn remover pads for treatment. Patient is here for further evaluation and treatment.   Past Medical History:  Diagnosis Date  . GERD (gastroesophageal reflux disease)   . Glaucoma    borderlind, not using drops  . Kidney stone       Objective: Physical Exam General: The patient is alert and oriented x3 in no acute distress.  Dermatology: Corns noted to the bilateral 2nd toes. Pain with palpation noted to the corns. Skin is cool, dry and supple bilateral lower extremities. Negative for open lesions or macerations.  Vascular: Palpable pedal pulses bilaterally. No edema or erythema noted. Capillary refill within normal limits.  Neurological: Epicritic and protective threshold grossly intact bilaterally.   Musculoskeletal Exam: All pedal and ankle joints range of motion within normal limits bilateral. Muscle strength 5/5 in all groups bilateral. Hammertoe contracture deformity noted to digits 2-5 of the bilateral feet.  Assessment: 1. Corns bilateral 2nd toes  2. 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 Patient evaluated. 2. Today we discussed the conservative versus surgical management of the presenting pathology. The patient opts for surgical management. All possible complications and details of the procedure were explained. All patient questions were answered. No guarantees were expressed or implied. 3. Authorization for surgery was initiated today. Surgery will consist of PIPJ arthroplasty with MPJ capsulotomy 2nd digits bilaterally; possible Weil osteotomy bilaterally.  4. Return to clinic one week post op.   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  Lives in [place].      Edrick Kins, DPM [facility]  Dr. Edrick Kins, DPM    [address], [place] [address]                Office [phone]  Fax [phone]

## 2020-01-19 ENCOUNTER — Telehealth: Payer: Self-pay

## 2020-01-19 NOTE — Telephone Encounter (Signed)
Patient called to cancel her surgery with Dr. Amalia Hailey on 02/11/20. She stated she needed to buy hearing aids first. She will call back to reschedule her surgery. Notified Cynthia at [employer].

## 2020-02-17 ENCOUNTER — Encounter: Payer: Medicare PPO | Admitting: Podiatry

## 2020-02-29 ENCOUNTER — Encounter: Payer: Medicare PPO | Admitting: Podiatry

## 2020-03-21 ENCOUNTER — Encounter: Payer: Medicare PPO | Admitting: Podiatry

## 2021-05-02 ENCOUNTER — Encounter: Payer: Self-pay | Admitting: Gastroenterology

## 2022-11-23 ENCOUNTER — Other Ambulatory Visit: Payer: Self-pay | Admitting: Obstetrics and Gynecology

## 2022-11-23 DIAGNOSIS — E2839 Other primary ovarian failure: Secondary | ICD-10-CM

## 2022-11-27 ENCOUNTER — Other Ambulatory Visit: Payer: Self-pay | Admitting: Obstetrics and Gynecology

## 2022-11-27 DIAGNOSIS — R928 Other abnormal and inconclusive findings on diagnostic imaging of breast: Secondary | ICD-10-CM

## 2022-12-14 ENCOUNTER — Other Ambulatory Visit: Payer: BC Managed Care – PPO

## 2022-12-14 ENCOUNTER — Ambulatory Visit
Admission: RE | Admit: 2022-12-14 | Discharge: 2022-12-14 | Disposition: A | Discharge status: Home / Self Care | Payer: Medicare PPO | Source: Ambulatory Visit | Attending: Obstetrics and Gynecology | Admitting: Obstetrics and Gynecology

## 2022-12-14 ENCOUNTER — Other Ambulatory Visit: Payer: Self-pay | Admitting: Obstetrics and Gynecology

## 2022-12-14 DIAGNOSIS — R928 Other abnormal and inconclusive findings on diagnostic imaging of breast: Secondary | ICD-10-CM

## 2022-12-14 DIAGNOSIS — R921 Mammographic calcification found on diagnostic imaging of breast: Secondary | ICD-10-CM

## 2023-07-15 ENCOUNTER — Ambulatory Visit
Admission: RE | Admit: 2023-07-15 | Discharge: 2023-07-15 | Disposition: A | Discharge status: Home / Self Care | Payer: Medicare PPO | Source: Ambulatory Visit | Attending: Obstetrics and Gynecology | Admitting: Obstetrics and Gynecology

## 2023-07-15 ENCOUNTER — Other Ambulatory Visit: Payer: Self-pay | Admitting: Obstetrics and Gynecology

## 2023-07-15 DIAGNOSIS — R921 Mammographic calcification found on diagnostic imaging of breast: Secondary | ICD-10-CM

## 2024-01-20 ENCOUNTER — Ambulatory Visit
Admission: RE | Admit: 2024-01-20 | Discharge: 2024-01-20 | Disposition: A | Discharge status: Home / Self Care | Source: Ambulatory Visit | Attending: Obstetrics and Gynecology | Admitting: Obstetrics and Gynecology

## 2024-01-20 DIAGNOSIS — R921 Mammographic calcification found on diagnostic imaging of breast: Secondary | ICD-10-CM
